# Patient Record
Sex: Male | Born: 1955
Health system: Southern US, Community
[De-identification: ages and names within clinical notes are randomized; demographics above are authoritative.]

## PROBLEM LIST (undated history)

## (undated) DIAGNOSIS — I10 Essential (primary) hypertension: Secondary | ICD-10-CM

## (undated) DIAGNOSIS — E785 Hyperlipidemia, unspecified: Secondary | ICD-10-CM

## (undated) DIAGNOSIS — Q639 Congenital malformation of kidney, unspecified: Secondary | ICD-10-CM

## (undated) DIAGNOSIS — K760 Fatty (change of) liver, not elsewhere classified: Secondary | ICD-10-CM

## (undated) HISTORY — PX: CARPAL TUNNEL RELEASE: SHX101

## (undated) HISTORY — DX: Essential (primary) hypertension: I10

## (undated) HISTORY — DX: Congenital malformation of kidney, unspecified: Q63.9

## (undated) HISTORY — PX: COLONOSCOPY: SHX174

## (undated) HISTORY — PX: VASECTOMY: SHX75

## (undated) HISTORY — PX: TONSILLECTOMY: SUR1361

## (undated) HISTORY — DX: Fatty (change of) liver, not elsewhere classified: K76.0

## (undated) HISTORY — DX: Hyperlipidemia, unspecified: E78.5

---

## 2006-09-13 ENCOUNTER — Ambulatory Visit (HOSPITAL_COMMUNITY): Admission: RE | Admit: 2006-09-13 | Discharge: 2006-09-13 | Payer: Self-pay | Admitting: Family Medicine

## 2010-03-25 ENCOUNTER — Encounter (INDEPENDENT_AMBULATORY_CARE_PROVIDER_SITE_OTHER): Payer: Self-pay | Admitting: *Deleted

## 2010-04-14 ENCOUNTER — Encounter (INDEPENDENT_AMBULATORY_CARE_PROVIDER_SITE_OTHER): Payer: Self-pay | Admitting: *Deleted

## 2010-04-16 ENCOUNTER — Ambulatory Visit: Payer: Self-pay | Admitting: Gastroenterology

## 2010-04-16 ENCOUNTER — Encounter (INDEPENDENT_AMBULATORY_CARE_PROVIDER_SITE_OTHER): Payer: Self-pay | Admitting: *Deleted

## 2010-04-30 ENCOUNTER — Ambulatory Visit: Payer: Self-pay | Admitting: Gastroenterology

## 2010-10-21 NOTE — Letter (Signed)
Summary: [facility] Instructions  [facility]  [address] [place], [place] [address]   Phone: [phone]  Fax: [phone]       Cody Powers    Dec 07, 1955    MRN: [id number]        Procedure Day /Date: 04/30/10  Wednesday     Arrival Time: 9:00am      Procedure Time: 10:00am     Location of Procedure:                    _ x_  [facility] (4th Floor)                        PREPARATION FOR COLONOSCOPY WITH MOVIPREP   Starting 5 days prior to your procedure _ 8/5/11_ do not eat nuts, seeds, popcorn, corn, beans, peas,  salads, or any raw vegetables.  Do not take any fiber supplements (e.g. Metamucil, Citrucel, and Benefiber).  THE DAY BEFORE YOUR PROCEDURE         DATE:  04/29/10   DAY:  Tuesday  1.  Drink clear liquids the entire day-NO SOLID FOOD  2.  Do not drink anything colored red or purple.  Avoid juices with pulp.  No orange juice.  3.  Drink at least 64 oz. (8 glasses) of fluid/clear liquids during the day to prevent dehydration and help the prep work efficiently.  CLEAR LIQUIDS INCLUDE: Water Jello Ice Popsicles Tea (sugar ok, no milk/cream) Powdered fruit flavored drinks Coffee (sugar ok, no milk/cream) Gatorade Juice: apple, white grape, white cranberry  Lemonade Clear bullion, consomm, broth Carbonated beverages (any kind) Strained chicken noodle soup Hard Candy                             4.  In the morning, mix first dose of MoviPrep solution:    Empty 1 Pouch A and 1 Pouch B into the disposable container    Add lukewarm drinking water to the top line of the container. Mix to dissolve    Refrigerate (mixed solution should be used within 24 hrs)  5.  Begin drinking the prep at 5:00 p.m. The MoviPrep container is divided by 4 marks.   Every 15 minutes drink the solution down to the next mark (approximately 8 oz) until the full liter is complete.   6.  Follow completed prep with 16 oz of clear liquid of your choice  (Nothing red or purple).  Continue to drink clear liquids until bedtime.  7.  Before going to bed, mix second dose of MoviPrep solution:    Empty 1 Pouch A and 1 Pouch B into the disposable container    Add lukewarm drinking water to the top line of the container. Mix to dissolve    Refrigerate  THE DAY OF YOUR PROCEDURE      DATE:   04/30/10  DAY:  Wednesday  Beginning at  5:00am (5 hours before procedure):         1. Every 15 minutes, drink the solution down to the next mark (approx 8 oz) until the full liter is complete.  2. Follow completed prep with 16 oz. of clear liquid of your choice.    3. You may drink clear liquids until 8:00am   (2 HOURS BEFORE PROCEDURE).   MEDICATION INSTRUCTIONS  Unless otherwise instructed, you should take regular prescription medications with a small sip of water  as early as possible the morning of your procedure.  Diabetic patients - see separate instructions.        OTHER INSTRUCTIONS  You will need a responsible adult at least 53 years of age to accompany you and drive you home.   This person must remain in the waiting room during your procedure.  Wear loose fitting clothing that is easily removed.  Leave jewelry and other valuables at home.  However, you may wish to bring a book to read or  an iPod/MP3 player to listen to music as you wait for your procedure to start.  Remove all body piercing jewelry and leave at home.  Total time from sign-in until discharge is approximately 2-3 hours.  You should go home directly after your procedure and rest.  You can resume normal activities the  day after your procedure.  The day of your procedure you should not:   Drive   Make legal decisions   Operate machinery   Drink alcohol   Return to work  You will receive specific instructions about eating, activities and medications before you leave.    The above instructions have been reviewed and explained to me by  Wyona Almas  RN  April 16, 2010 4:45 PM     I fully understand and can verbalize these instructions _____________________________ Date _________

## 2010-10-21 NOTE — Letter (Signed)
Summary: Diabetic Instructions  [facility]  [address] [place], [place] [address]   Phone: [phone]  Fax: [phone]    Cody Powers [birth year] MRN: [id number]   _ X _   ORAL DIABETIC MEDICATION INSTRUCTIONS  The day before your procedure:   Take your diabetic pill as you do normally  The day of your procedure:   Do not take your diabetic pill    We will check your blood sugar levels during the admission process and again in Recovery before discharging you home  ________________________________________________________________________

## 2010-10-21 NOTE — Letter (Signed)
Summary: Previsit letter  [facility]  [address] [place], [place] [address]   Phone: [phone]  Fax: [phone]       03/25/2010 MRN: [id number]  Cody Powers [address] [place], [place]  [address]  Dear Mr. [facility],  Welcome to the Gastroenterology Division at [facility].    You are scheduled to see a nurse for your pre-procedure visit on 04-16-10 at 4:30p.m. on the 3rd floor at [facility], [address]  We ask that you try to arrive at our office 15 minutes prior to your appointment time to allow for check-in.  Your nurse visit will consist of discussing your medical and surgical history, your immediate family medical history, and your medications.    Please bring a complete list of all your medications or, if you prefer, bring the medication bottles and we will list them.  We will need to be aware of both prescribed and over the counter drugs.  We will need to know exact dosage information as well.  If you are on blood thinners (Coumadin, Plavix, Aggrenox, Ticlid, etc.) please call our office today/prior to your appointment, as we need to consult with your physician about holding your medication.   Please be prepared to read and sign documents such as consent forms, a financial agreement, and acknowledgement forms.  If necessary, and with your consent, a friend or relative is welcome to sit-in on the nurse visit with you.  Please bring your insurance card so that we may make a copy of it.  If your insurance requires a referral to see a specialist, please bring your referral form from your primary care physician.  No co-pay is required for this nurse visit.     If you cannot keep your appointment, please call [phone] to cancel or reschedule prior to your appointment date.  This allows [place] the opportunity to schedule an appointment for another patient in need of care.    Thank you for choosing [facility] for your medical needs.   We appreciate the opportunity to care for you.  Please visit [place] at our website  to learn more about our practice.                     Sincerely.                                                                                                                   The Gastroenterology Division

## 2010-10-21 NOTE — Procedures (Signed)
Summary: Colonoscopy  Patient: [name] Note: All result statuses are Final unless otherwise noted.  Tests: (1) Colonoscopy (COL)   COL Colonoscopy           DONE     [facility]     [address]     [place], [place]  [address]           COLONOSCOPY PROCEDURE REPORT           PATIENT:  Cody Powers, Cody Powers  MR#:  [id number]     BIRTHDATE:  06-04-1956, 53 yrs. old  GENDER:  male           ENDOSCOPIST:  Barbette Hair. Arlyce Dice, MD     Referred by:           PROCEDURE DATE:  04/30/2010     PROCEDURE:  Diagnostic Colonoscopy     ASA CLASS:  Class I     INDICATIONS:  1) Routine Risk Screening           MEDICATIONS:   Fentanyl 75 mcg IV, Versed 8 mg IV           DESCRIPTION OF PROCEDURE:   After the risks benefits and     alternatives of the procedure were thoroughly explained, informed     consent was obtained.  Digital rectal exam was performed and     revealed no abnormalities.   The LB CF-[id number] endoscope     was introduced through the anus and advanced to the cecum, which     was identified by both the appendix and ileocecal valve, without     limitations.  The quality of the prep was excellent, using     MoviPrep.  The instrument was then slowly withdrawn as the colon     was fully examined.     <<PROCEDUREIMAGES>>           FINDINGS:  A lipoma was found in the ascending colon (see image4).     4mm yellow subcutaneous mass c/w lipoma  This was otherwise a     normal examination of the colon (see image1, image3, image5,     image7, image9, image12, image15, image16, and image19).     Retroflexed views in the rectum revealed no abnormalities.    The     time to cecum =  2.25  minutes. The scope was then withdrawn (time     =  6.75  min) from the patient and the procedure completed.           COMPLICATIONS:  None           ENDOSCOPIC IMPRESSION:     1) Lipoma in the ascending colon     2) Otherwise normal examination     RECOMMENDATIONS:     1) Continue  current colorectal screening recommendations for     "routine risk" patients with a repeat colonoscopy in 10 years.           REPEAT EXAM:  In 10 year(s) for Colonoscopy.           ______________________________     Barbette Hair. Arlyce Dice, MD           CC: Dwana Melena MD           n.     Rosalie DoctorBarbette Hair. Apolonia Ellwood at 04/30/2010 10:52 AM           Crawford Givens, [id number]  Note: An exclamation mark (!) indicates a result  that was not dispersed into the flowsheet. Document Creation Date: 04/30/2010 10:52 AM _______________________________________________________________________  (1) Order result status: Final Collection or observation date-time: 04/30/2010 10:45 Requested date-time:  Receipt date-time:  Reported date-time:  Referring Physician:   Ordering Physician: Melvia Heaps [phone]) Specimen Source:  Source: Launa Grill Order Number: [id number] Lab site:   Appended Document: Colonoscopy    Clinical Lists Changes  Observations: Added new observation of COLONNXTDUE: 04/2020 (04/30/2010 12:47)

## 2010-10-21 NOTE — Miscellaneous (Signed)
Summary: [facility] Previsit/prep  Clinical Lists Changes  Medications: Added new medication of MOVIPREP 100 GM  SOLR (PEG-KCL-NACL-NASULF-NA ASC-C) As per prep instructions. - Signed Rx of MOVIPREP 100 GM  SOLR (PEG-KCL-NACL-NASULF-NA ASC-C) As per prep instructions.;  #1 x 0;  Signed;  Entered by: Wyona Almas RN;  Authorized by: Louis Meckel MD;  Method used: Electronically to [facility]. [phone]*, [address] [place], [place], [place]  [address], Ph: [phone] or [phone], Fax: [phone] Observations: Added new observation of NKA: T (04/16/2010 16:11)    Prescriptions: MOVIPREP 100 GM  SOLR (PEG-KCL-NACL-NASULF-NA ASC-C) As per prep instructions.  #1 x 0   Entered by:   Wyona Almas RN   Authorized by:   Louis Meckel MD   Signed by:   Wyona Almas RN on 04/16/2010   Method used:   Electronically to        [facility] [phone]* (retail)       [address]       [place], [place]  [address]       Ph: [phone] or [phone]       Fax: [phone]   RxID:   [id number]

## 2010-12-05 LAB — GLUCOSE, CAPILLARY
Glucose-Capillary: 107 mg/dL — ABNORMAL HIGH (ref 70–99)
Glucose-Capillary: 122 mg/dL — ABNORMAL HIGH (ref 70–99)

## 2011-02-11 ENCOUNTER — Other Ambulatory Visit (HOSPITAL_COMMUNITY): Payer: Self-pay | Admitting: Internal Medicine

## 2011-02-11 DIAGNOSIS — R1012 Left upper quadrant pain: Secondary | ICD-10-CM

## 2011-02-13 ENCOUNTER — Other Ambulatory Visit (HOSPITAL_COMMUNITY): Payer: Self-pay

## 2011-02-17 ENCOUNTER — Ambulatory Visit (HOSPITAL_COMMUNITY)
Admission: RE | Admit: 2011-02-17 | Discharge: 2011-02-17 | Disposition: A | Discharge status: Home / Self Care | Payer: BC Managed Care – PPO | Source: Ambulatory Visit | Attending: Internal Medicine | Admitting: Internal Medicine

## 2011-02-17 DIAGNOSIS — R1012 Left upper quadrant pain: Secondary | ICD-10-CM | POA: Insufficient documentation

## 2011-02-17 DIAGNOSIS — Q619 Cystic kidney disease, unspecified: Secondary | ICD-10-CM | POA: Insufficient documentation

## 2011-02-17 DIAGNOSIS — Q602 Renal agenesis, unspecified: Secondary | ICD-10-CM | POA: Insufficient documentation

## 2011-03-02 ENCOUNTER — Telehealth: Payer: Self-pay | Admitting: Gastroenterology

## 2011-03-02 NOTE — Telephone Encounter (Signed)
Patient's wife calling to report that patient has been having right abdominal pain that goes around his back and to his shoulder blade. He saw his PCP for this and had an ultrasound because he thought it was his gallbladder. Ultrasound showed fatty liver and enlarged spleen. PCP told patient to contact his GI if the pain continued. Patient had an episode of pain again yesterday. The pain comes and goes and is not associated with eating. Patient had some diarrhea last week also. He is trying to avoid fatty foods. Wants to be seen. Scheduled with Mike Gip, PA on 03/05/11 at 3:30 PM.

## 2011-03-02 NOTE — Telephone Encounter (Signed)
Has pt been seen here before? He needs CBC, LFTs, amylase and a HIDA scan

## 2011-03-03 NOTE — Telephone Encounter (Signed)
Labs in [facility] for 03/05/11. Patient's wife aware that patient needs to get labs drawn prior to visit.

## 2011-03-05 ENCOUNTER — Other Ambulatory Visit: Payer: Self-pay | Admitting: Physician Assistant

## 2011-03-05 ENCOUNTER — Encounter: Payer: Self-pay | Admitting: Physician Assistant

## 2011-03-05 ENCOUNTER — Other Ambulatory Visit (INDEPENDENT_AMBULATORY_CARE_PROVIDER_SITE_OTHER): Payer: BC Managed Care – PPO

## 2011-03-05 ENCOUNTER — Ambulatory Visit (INDEPENDENT_AMBULATORY_CARE_PROVIDER_SITE_OTHER): Payer: BC Managed Care – PPO | Admitting: Physician Assistant

## 2011-03-05 VITALS — BP 124/70 | HR 68 | Ht 72.0 in | Wt 238.0 lb

## 2011-03-05 DIAGNOSIS — E785 Hyperlipidemia, unspecified: Secondary | ICD-10-CM

## 2011-03-05 DIAGNOSIS — K760 Fatty (change of) liver, not elsewhere classified: Secondary | ICD-10-CM | POA: Insufficient documentation

## 2011-03-05 DIAGNOSIS — M109 Gout, unspecified: Secondary | ICD-10-CM | POA: Insufficient documentation

## 2011-03-05 DIAGNOSIS — R109 Unspecified abdominal pain: Secondary | ICD-10-CM

## 2011-03-05 DIAGNOSIS — R1011 Right upper quadrant pain: Secondary | ICD-10-CM

## 2011-03-05 DIAGNOSIS — E119 Type 2 diabetes mellitus without complications: Secondary | ICD-10-CM | POA: Insufficient documentation

## 2011-03-05 DIAGNOSIS — K7689 Other specified diseases of liver: Secondary | ICD-10-CM

## 2011-03-05 DIAGNOSIS — R935 Abnormal findings on diagnostic imaging of other abdominal regions, including retroperitoneum: Secondary | ICD-10-CM

## 2011-03-05 DIAGNOSIS — R160 Hepatomegaly, not elsewhere classified: Secondary | ICD-10-CM

## 2011-03-05 DIAGNOSIS — I10 Essential (primary) hypertension: Secondary | ICD-10-CM | POA: Insufficient documentation

## 2011-03-05 LAB — BASIC METABOLIC PANEL
CO2: 27 mEq/L (ref 19–32)
Calcium: 9.4 mg/dL (ref 8.4–10.5)
Chloride: 106 mEq/L (ref 96–112)
Creatinine, Ser: 1.2 mg/dL (ref 0.4–1.5)
GFR: 67.45 mL/min (ref >60.00)
Glucose, Bld: 106 mg/dL — ABNORMAL HIGH (ref 70–99)

## 2011-03-05 LAB — CBC WITH DIFFERENTIAL/PLATELET
Eosinophils Absolute: 0.2 10*3/uL (ref 0.0–0.7)
HCT: 41.4 % (ref 39.0–52.0)
Hemoglobin: 14 g/dL (ref 13.0–17.0)
Lymphs Abs: 2.1 10*3/uL (ref 0.7–4.0)
MCHC: 33.8 g/dL (ref 30.0–36.0)
MCV: 88.9 fl (ref 78.0–100.0)
Monocytes Relative: 7.8 % (ref 3.0–12.0)
Neutro Abs: 5.4 10*3/uL (ref 1.4–7.7)

## 2011-03-05 LAB — HEPATIC FUNCTION PANEL
ALT: 35 U/L (ref 0–53)
AST: 27 U/L (ref 0–37)
Albumin: 4.4 g/dL (ref 3.5–5.2)
Bilirubin, Direct: 0.1 mg/dL (ref 0.0–0.3)
Total Protein: 6.9 g/dL (ref 6.0–8.3)

## 2011-03-05 LAB — AMYLASE: Amylase: 52 U/L (ref 27–131)

## 2011-03-05 NOTE — Patient Instructions (Signed)
Please go to the basement level to have your labs drawn.  We scheduled the Ct Scan at [facility] Radiology Department 1st floor. Directions and contrast provided. We have given you samples of Prilosec OTC capsules, Take 1 capsule in the morning 30 min before breakfast.

## 2011-03-05 NOTE — Progress Notes (Signed)
Subjective:    Patient ID: Cody Powers, male    DOB: 07/20/1956, 55 y.o.   MRN: [id number]  HPI Cody Powers is a 55 year old white male known to Dr. Arlyce Dice from screening colonoscopy done August of 2011. He was noted to have a small lipoma in the ascending colon, and otherwise had normal exam. He is referred today for evaluation of right-sided abdominal pain and an abnormal ultrasound. Patient states that he has been having episodes of right-sided abdominal pain over the past 8-10 weeks. He initially had a bad episode with right upper quadrant pain and right back pain which he describes as "like a knife sticking in his back". This lasted for 3-4 hours was not associated with any nausea or vomiting he did have some diaphoresis and eventually eased off.. Since that time he describes "twinges" of right upper quadrant and right back pain which are occurring at least a couple of times per week. He seemed to be aggravated by EEG and an especially with greasy foods. His appetite has been fine and his weight has been stable. About 2 weekends ago he had another more severe attack but not as bad as the first. Again no nausea or vomiting. He was seen by primary care had an upper abdominal ultrasound done on May 29 and was noted to have a small amount of sludge in the gallbladder, no wall thickening,no pericholecystic   fluid and normal gallbladder wall thickness. Common bile duct 2.4 mm he does have an enlarged liver portal vein patent pancreas difficult to visualize spleen is also enlarged measuring 13.7 x 12.9 x 6.6 cm in volume volume measured at 610 mL. He had labs done as well which I do not have available at the time of this visit.  Review of old records shows CT scan in 2007 which did show hepatomegaly which was felt consistent with fatty liver, spleen was read as normal.  Patient does not drink alcohol, he is diabetic and has history of hypertension and hyperlipidemia. He also describes increased heartburn  and indigestion recently.    Review of Systems  Constitutional: Negative.   HENT: Negative.   Eyes: Negative.   Respiratory: Negative.   Cardiovascular: Negative.   Gastrointestinal: Positive for nausea and abdominal pain.  Genitourinary: Negative.   Musculoskeletal: Positive for back pain.  Skin: Negative.   Neurological: Negative.   Hematological: Negative.   Psychiatric/Behavioral: Negative.    Outpatient Encounter Prescriptions as of 03/05/2011  Medication Sig Dispense Refill  . allopurinol (ZYLOPRIM) 300 MG tablet Take 300 mg by mouth daily.        Marland Kitchen aspirin 81 MG tablet Take 81 mg by mouth daily.        . Choline Fenofibrate (TRILIPIX) 135 MG capsule Take 135 mg by mouth daily.        . indomethacin (INDOCIN) 50 MG capsule Take 50 mg by mouth 3 (three) times daily as needed.        Marland Kitchen lisinopril (PRINIVIL,ZESTRIL) 20 MG tablet Take 20 mg by mouth daily.        . metFORMIN (GLUCOPHAGE) 500 MG tablet Take 500 mg by mouth 2 (two) times daily with a meal.        . Omega-3 Fatty Acids (FISH OIL) 1000 MG CAPS Take 1 capsule by mouth daily.        . pravastatin (PRAVACHOL) 20 MG tablet Take 20 mg by mouth daily.              Objective:  Physical Exam Well-developed white male in no acute distress, alert and oriented x3, pleasant HEENT; nontraumatic normocephalic EOMI PERRLA sclera anicteric  Neck; Supple no JVD  Cardiovascular; regular rate and rhythm with S1-S2 no murmur rub or gallop  Pulmonary; clear bilaterally  Abdomen ;soft basically nontender obese bowel sounds active no palpable spleen tip liver edge palpable at right costal margin no fluid wave  Recta;l not done  Extremities/skin; benign no edema no palmar erythema or other stigmata of chronic liver disease  Psych; mood and affect normal an appropriate        Assessment & Plan:  #81 55 year old male with episodic right upper quadrant and right back pain, and ultrasound findings of gallbladder sludge, hepatomegaly and  splenomegaly. It is unclear at this time whether he is periods and biliary colic or pain secondary to hepatomegaly, and/or underlying liver disease.  Plan; CBC ,CMRT, ProTime INR and lipase today Schedule for CT scan of the abdomen and pelvis with contrast. Will consider [facility] HIDA scan depending on results of CT, and lab work. Advised low-fat low-cholesterol diet  #2 Colon neoplasia screening, up-to-date  #3 Adult onset diabetes mellitus  #4 hyperlipidemia  #5 obesity.

## 2011-03-06 ENCOUNTER — Telehealth: Payer: Self-pay | Admitting: *Deleted

## 2011-03-06 NOTE — Telephone Encounter (Signed)
Message copied by Daphine Deutscher on Fri Mar 06, 2011  1:26 PM ------      Message from: [place], [place] S      Created: Fri Mar 06, 2011 12:45 PM       PLEASE LET PT KNOW THAT ALL OF HIS LABS FROM YESTERDAY ARE NORMAL.

## 2011-03-06 NOTE — Progress Notes (Signed)
Reviewed and agree.

## 2011-03-06 NOTE — Telephone Encounter (Signed)
Left a message for patient of lab results as per Mike Gip, PA

## 2011-03-10 ENCOUNTER — Ambulatory Visit (HOSPITAL_COMMUNITY)
Admission: RE | Admit: 2011-03-10 | Discharge: 2011-03-10 | Disposition: A | Discharge status: Home / Self Care | Payer: BC Managed Care – PPO | Source: Ambulatory Visit | Attending: Physician Assistant | Admitting: Physician Assistant

## 2011-03-10 ENCOUNTER — Encounter (HOSPITAL_COMMUNITY): Payer: Self-pay

## 2011-03-10 ENCOUNTER — Telehealth: Payer: Self-pay | Admitting: Physician Assistant

## 2011-03-10 DIAGNOSIS — K7689 Other specified diseases of liver: Secondary | ICD-10-CM | POA: Insufficient documentation

## 2011-03-10 DIAGNOSIS — Q619 Cystic kidney disease, unspecified: Secondary | ICD-10-CM | POA: Insufficient documentation

## 2011-03-10 DIAGNOSIS — I10 Essential (primary) hypertension: Secondary | ICD-10-CM | POA: Insufficient documentation

## 2011-03-10 DIAGNOSIS — R1011 Right upper quadrant pain: Secondary | ICD-10-CM | POA: Insufficient documentation

## 2011-03-10 DIAGNOSIS — R935 Abnormal findings on diagnostic imaging of other abdominal regions, including retroperitoneum: Secondary | ICD-10-CM

## 2011-03-10 MED ORDER — IOHEXOL 300 MG/ML  SOLN
100.0000 mL | Freq: Once | INTRAMUSCULAR | Status: AC | PRN
  Administered 2011-03-10: 100 mL via INTRAVENOUS

## 2011-03-10 NOTE — Telephone Encounter (Signed)
The CT department of [facility] needed me to fax the orders for the CT Abdomen & Pelvis with contrast for today 03-10-2011.  The orders are in epic but they couldn't find them.  I faxed the order along with the labs, BMET for them to [phone].

## 2011-03-13 ENCOUNTER — Telehealth: Payer: Self-pay | Admitting: Physician Assistant

## 2011-03-16 ENCOUNTER — Other Ambulatory Visit: Payer: Self-pay | Admitting: Gastroenterology

## 2011-03-16 ENCOUNTER — Telehealth: Payer: Self-pay

## 2011-03-16 DIAGNOSIS — K8689 Other specified diseases of pancreas: Secondary | ICD-10-CM

## 2011-03-16 NOTE — Telephone Encounter (Signed)
Spoke with wife and she is aware of results per Mike Gip PA and MRI appt date and time.

## 2011-03-16 NOTE — Telephone Encounter (Signed)
Message copied by Michele Mcalpine on Mon Mar 16, 2011  1:54 PM ------      Message from: [place], [place] S      Created: Mon Mar 16, 2011  1:17 PM       LINDA, I CALLED AND LEFT A MESSAGE FOR THIS PT TO CALL BACK ABOUT HIS CT RESULTS. HE IS HAVING PAIN, AND CT SHOWS ABNORMAL AREA IN TAIL OF PANCREAS. HE NEEDS  TO BE SCHEDULED FOR MRI OF ABDOMEN WITH PANCREATIC PROTOCOL  WITH AND WITHOUT CONTRAST. PLEASE SCHEDULE FOR THIS WEEK IF POSSIBLE.-THANKS,AMY

## 2011-03-16 NOTE — Telephone Encounter (Signed)
Pt scheduled for MRI of abdomen with and without contrast, pancreatic protocol for 03/20/11@[facility] . Pt to arreive at 7:45am, scan time 8am. Pt to be NPO after midnight. Left message for pt to call back.

## 2011-03-20 ENCOUNTER — Ambulatory Visit (HOSPITAL_COMMUNITY)
Admission: RE | Admit: 2011-03-20 | Discharge: 2011-03-20 | Disposition: A | Discharge status: Home / Self Care | Payer: BC Managed Care – PPO | Source: Ambulatory Visit | Attending: Gastroenterology | Admitting: Gastroenterology

## 2011-03-20 DIAGNOSIS — K863 Pseudocyst of pancreas: Secondary | ICD-10-CM | POA: Insufficient documentation

## 2011-03-20 DIAGNOSIS — K8689 Other specified diseases of pancreas: Secondary | ICD-10-CM

## 2011-03-20 DIAGNOSIS — Q602 Renal agenesis, unspecified: Secondary | ICD-10-CM | POA: Insufficient documentation

## 2011-03-20 DIAGNOSIS — K862 Cyst of pancreas: Secondary | ICD-10-CM | POA: Insufficient documentation

## 2011-03-20 MED ORDER — GADOBENATE DIMEGLUMINE 529 MG/ML IV SOLN
20.0000 mL | Freq: Once | INTRAVENOUS | Status: AC | PRN
  Administered 2011-03-20: 20 mL via INTRAVENOUS

## 2011-03-23 ENCOUNTER — Telehealth: Payer: Self-pay | Admitting: Physician Assistant

## 2011-03-23 NOTE — Telephone Encounter (Signed)
MRI SHOWS CYSTS IN THE TAIL OF THE PANCREAS WHICH APPEAR BENIGN. I WOULD LIKE HIM TO HAVE APPT WITH DR. KAPLAN TO GO OVER [facility] AND DECIDE IF HE NEEDS ANY FURTHER WORKUP. PLEASE GET HIM APPT WITH DR. KAPLAN FOR ASAP.

## 2011-03-24 NOTE — Telephone Encounter (Signed)
I spoke to the pt's wife Tammy and advised her of the results of hte MRI per Mike Gip PA.  I was looking at Dr. Nita Sells schedule for July and could find no appoitment time for him. I told her we will call her back once we have a chance to find him a spot. I mentioned to Leeroy Cha we will see what we can do about getting him in. I told her I will speak to Dr. Marzetta Board nurse.

## 2011-03-24 NOTE — Telephone Encounter (Signed)
Pt scheduled to see Dr. Arlyce Dice 04/07/11@2 :30pm. Pt aware of appt date and time.

## 2011-03-24 NOTE — Telephone Encounter (Signed)
He should have an appointment sometime in the next 3 weeks.  OK to double book.

## 2011-03-24 NOTE — Telephone Encounter (Signed)
Dr. Arlyce Dice do you just want me to double book this pt for an asap appt.? Please advise.

## 2011-04-07 ENCOUNTER — Encounter: Payer: Self-pay | Admitting: Gastroenterology

## 2011-04-07 ENCOUNTER — Ambulatory Visit (INDEPENDENT_AMBULATORY_CARE_PROVIDER_SITE_OTHER): Payer: BC Managed Care – PPO | Admitting: Gastroenterology

## 2011-04-07 VITALS — BP 134/76 | HR 88 | Ht 72.0 in | Wt 237.0 lb

## 2011-04-07 DIAGNOSIS — R1011 Right upper quadrant pain: Secondary | ICD-10-CM

## 2011-04-07 MED ORDER — CELECOXIB 200 MG PO CAPS: 200.0000 mg | ORAL_CAPSULE | Freq: Two times a day (BID) | ORAL | Status: AC

## 2011-04-07 NOTE — Patient Instructions (Signed)
Follow up as needed

## 2011-04-07 NOTE — Assessment & Plan Note (Addendum)
This appears to be musculoskeletal pain.  Recommendations #1 Celebrex 200 mg daily for 14 days

## 2011-04-07 NOTE — Progress Notes (Signed)
History of Present Illness:  [name] is here for followup of abdominal pain.  He continues to c/o pain along his right flank, worsened with bending and twisting.  It is sharp and lasts for seconds at a time.  Recent CT and MRI were remarkable for benign appearing pancreatic cysts.    Review of Systems: Pertinent positive and negative review of systems were noted in the above HPI section. All other review of systems were otherwise negative.    Current Medications, Allergies, Past Medical History, Past Surgical History, Family History and Social History were reviewed in [facility] electronic medical record  Vital signs were reviewed in today's medical record. Physical Exam: General: Well developed , well nourished, no acute distress  On abdominal exam there is tenderness to palpation in the right posterior ribs that reproduces his pain. There are no dominant masses organomegaly

## 2011-04-08 ENCOUNTER — Telehealth: Payer: Self-pay | Admitting: Gastroenterology

## 2011-04-15 NOTE — Telephone Encounter (Signed)
PT NEEDS PRIOR AUTHORIZATION FOR MEDICATION CONTACTED [employer] THEY HAD BEEN FAXING THE REQUEST TO AN INCORRECT PHONE NUMBER GAVE [employer] CORRECT FAX NUMBER FOR THEM TO SEND THE PRIOR AUTH FORM.

## 2011-04-16 NOTE — Telephone Encounter (Signed)
DR Arlyce Dice, YOU PRESCRIBED CELEBREX FOR THIS PT, THE INSURANCE DONT WANT TO PAY FOR IT, IS THERE SOMETHING ELSE HE CAN TRY??

## 2011-04-17 NOTE — Telephone Encounter (Signed)
PTS INSURANCE WILL COVER MOBIC OR MOLOXICAM   DR KAPLAN WHICH DO YOU WANT TO PRESCRIBE

## 2011-04-19 NOTE — Telephone Encounter (Signed)
meloxicam 7.5 mg qd

## 2011-04-20 ENCOUNTER — Telehealth: Payer: Self-pay | Admitting: Gastroenterology

## 2011-04-20 MED ORDER — MELOXICAM 7.5 MG PO TABS: 7.5000 mg | ORAL_TABLET | Freq: Every day | ORAL | Status: AC

## 2011-04-20 NOTE — Telephone Encounter (Signed)
Meloxicam sent in for pt.

## 2011-04-20 NOTE — Telephone Encounter (Signed)
Dr Arlyce Dice approved for pt to be on Meloxicam.. This is approved through pts insurance

## 2011-07-28 NOTE — Telephone Encounter (Signed)
done

## 2012-05-06 IMAGING — US US ABDOMEN COMPLETE
1 series · 13 of 25 positions shown · non-contrast
Comparison: CT 09/13/2006.

CLINICAL DATA: History of left upper quadrant abdominal pain.
History of congenital absence of the left kidney.  History of fatty
infiltration of the liver.

ABDOMINAL ULTRASOUND COMPLETE

[Series 1: us abdomen complete · 0.34mm/px · 13 of 78 slices shown]
[im 1/78]
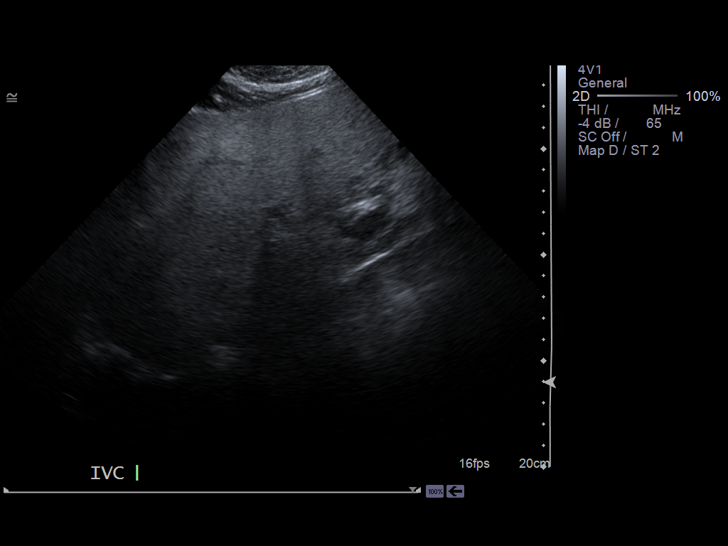
[im 7/78]
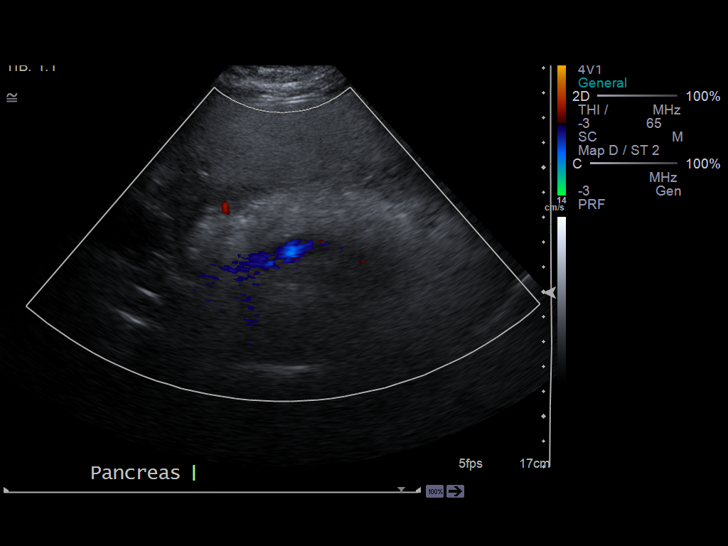
[im 13/78]
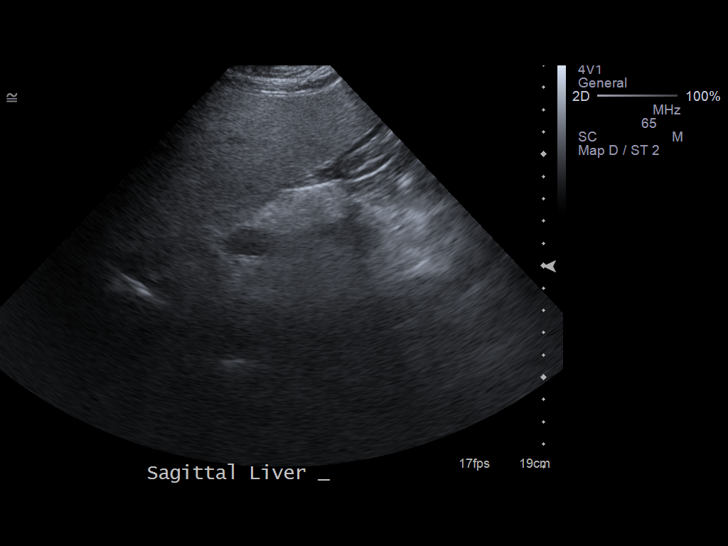
[im 20/78]
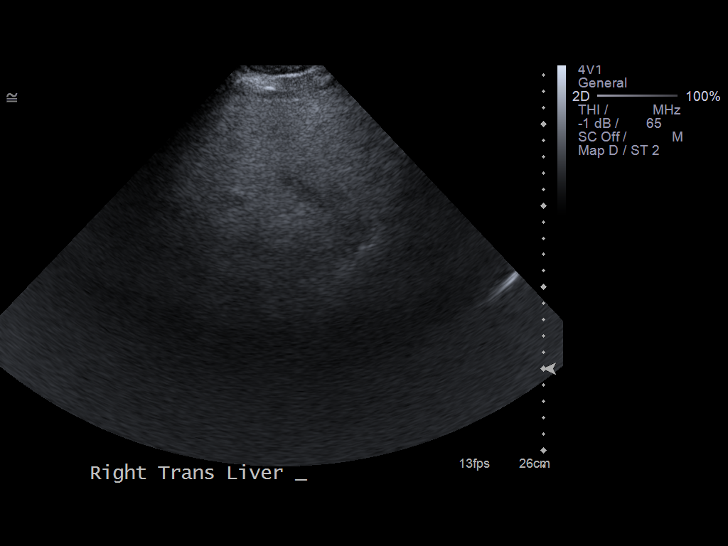
[im 26/78]
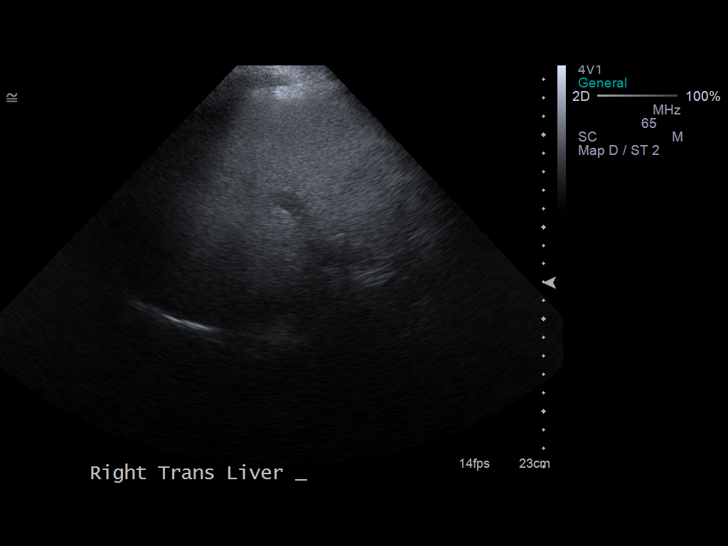
[im 33/78]
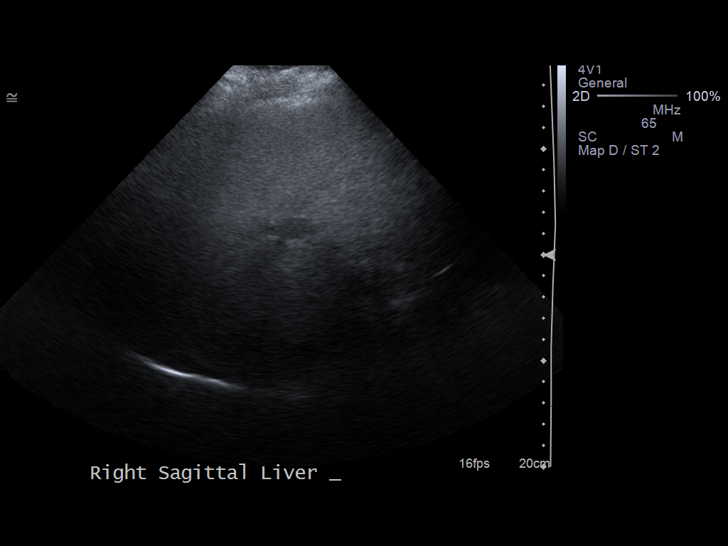
[im 39/78]
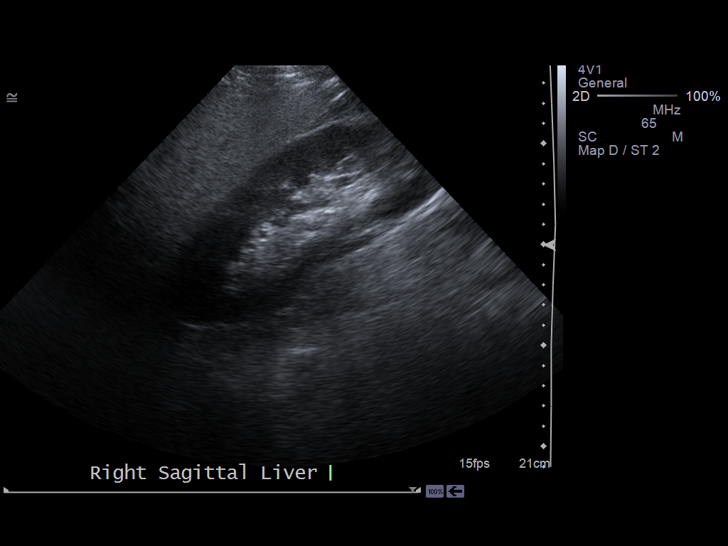
[im 45/78]
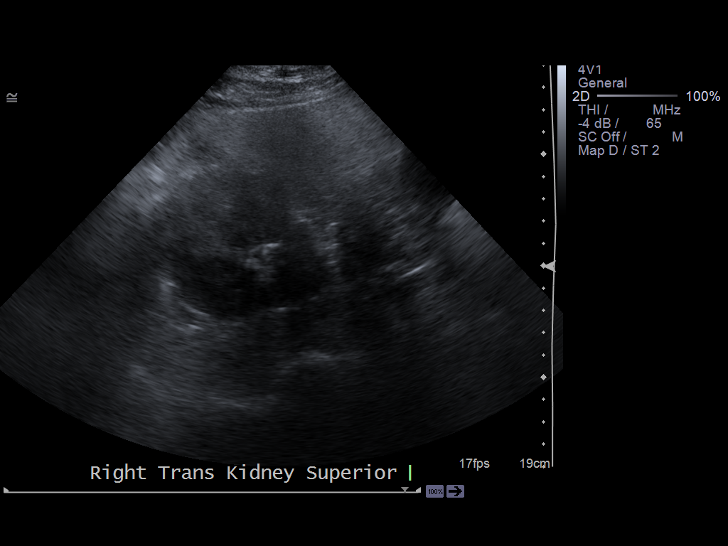
[im 52/78]
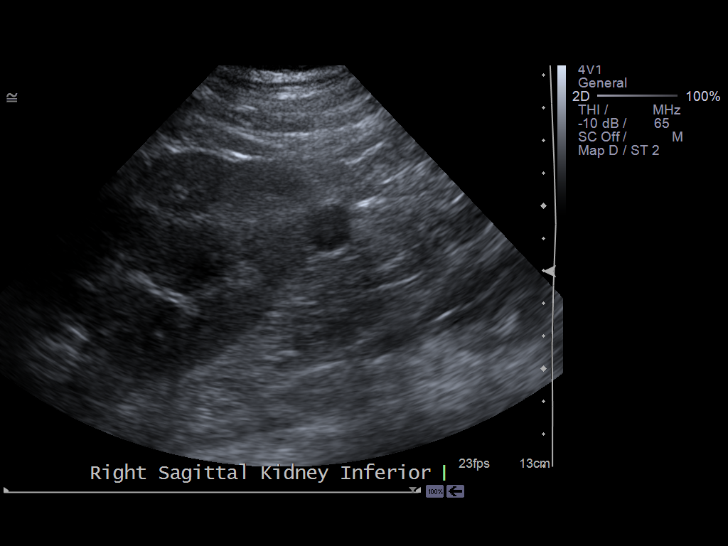
[im 58/78]
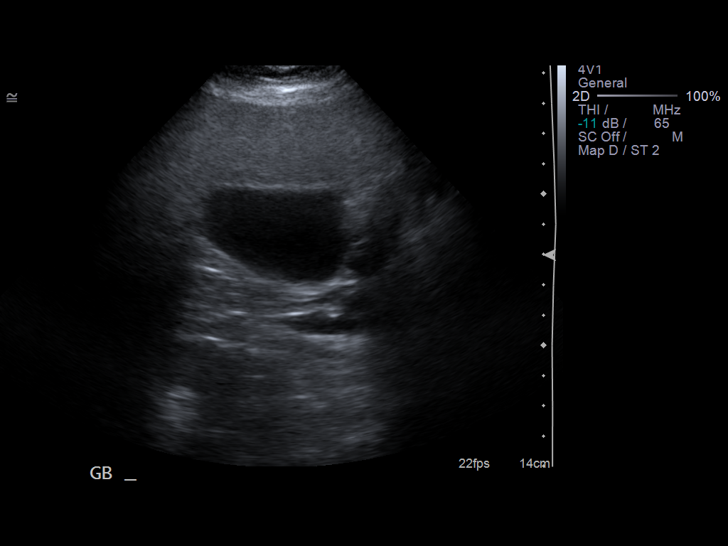
[im 65/78]
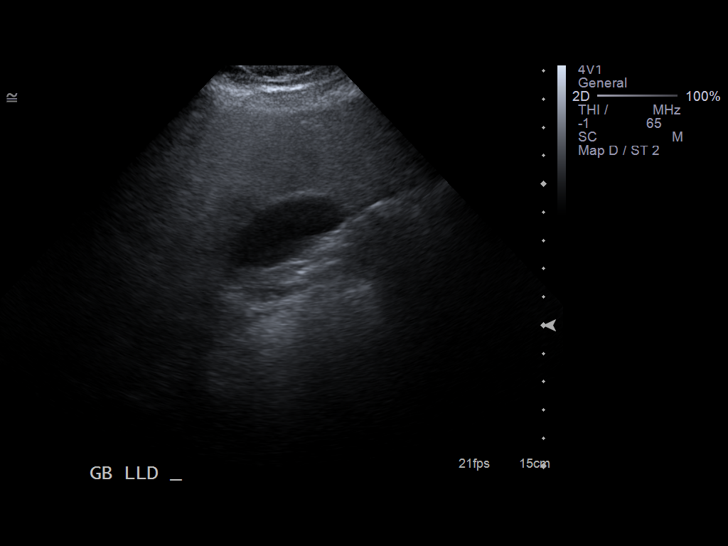
[im 71/78]
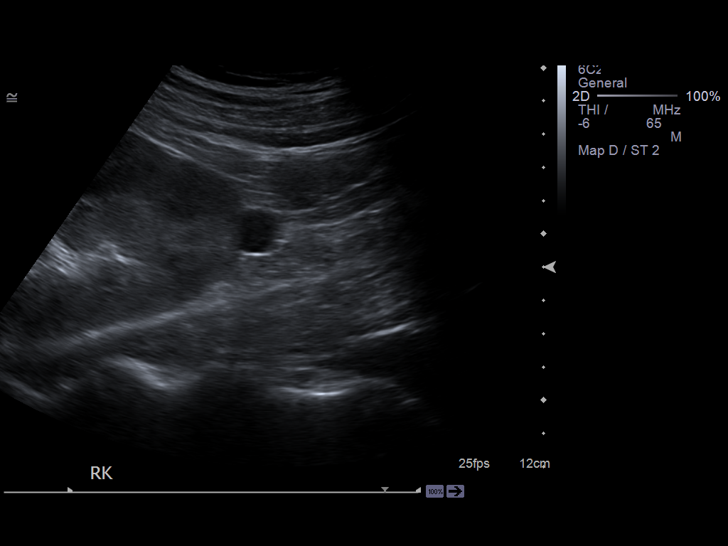
[im 78/78]
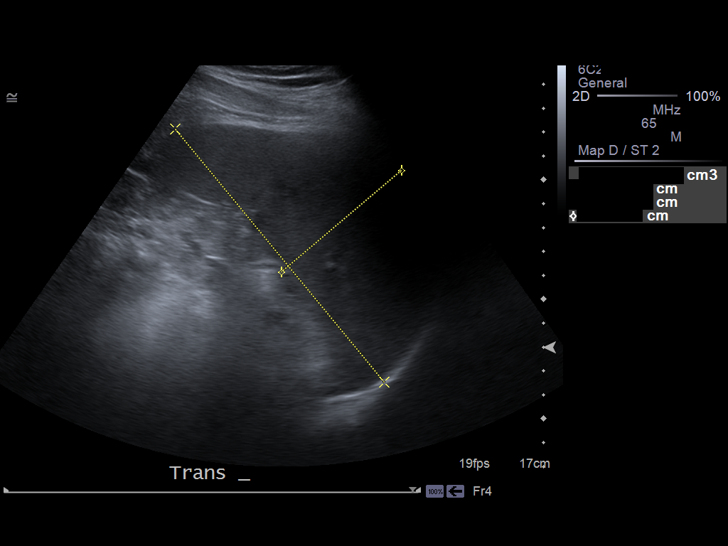

[13 of 25 positions shown; findings below may reference images not displayed]

FINDINGS: Gallbladder: No shadowing gallstones.  There is a small amount of
echogenic sludge. No gallbladder wall thickening or pericholecystic
fluid. The gallbladder wall thickness measured 1.2 mm. No
sonographic Murphy's sign according to the ultrasound technologist.

CBD: Normal in caliber measuring 2.4 mm. No choledocholithiasis is
evident.

Liver: There is hepatomegaly. There is increased echogenicity of
the hepatic parenchymal echotexture without focal parenchymal
abnormality. The portal vein is patent with hepatopetal flow by
color Doppler imaging.

IVC:  Patent throughout its visualized course in the abdomen.

Pancreas:  Although the pancreas is difficult to visualize in its
entirety, no focal pancreatic abnormality is identified.

Spleen: Spleen measures 13.7 x 12.9 x 6.6 cm.  This is
splenomegaly.  Volume measured 610 ml.  No focal splenic
abnormality was seen.

Right kidney:  No hydronephrosis.  Well-preserved cortex.  Normal
parenchymal echotexture with compensatory hypertrophy.  Right renal
length is 16.1 cm. Small lower pole renal renal cyst seen on CT.
This cyst is seen on ultrasound and the cyst measures 1.5 x
cm. Appears simple.

Left kidney: No left kidney is evident.

Aorta:  Maximum diameter is 2.9 cm.  No aneurysm is evident.

Ascites:  None.
IMPRESSION: No acute abdominal pathology was demonstrated.  No cholelithiasis
or evidence of cholecystitis.  Bile ducts normal in caliber.

Hepatomegaly.  Increased echogenicity of hepatic parenchyma.  Most
commonly this is associated with fatty infiltration of the liver.
No definite serosal nodularity or scalloping is seen.  Patent
portal vein with hepatopetal flow.

Splenomegaly.

No ascites evident.

History of congenital absence of the left kidney.  Compensatory
hypertrophy of right kidney.  Stable small right renal cyst.

## 2014-05-29 ENCOUNTER — Encounter: Payer: Self-pay | Admitting: Gastroenterology

## 2015-12-23 DIAGNOSIS — L940 Localized scleroderma [morphea]: Secondary | ICD-10-CM | POA: Diagnosis not present

## 2015-12-23 DIAGNOSIS — L570 Actinic keratosis: Secondary | ICD-10-CM | POA: Diagnosis not present

## 2016-01-07 DIAGNOSIS — E782 Mixed hyperlipidemia: Secondary | ICD-10-CM | POA: Diagnosis not present

## 2016-01-07 DIAGNOSIS — E119 Type 2 diabetes mellitus without complications: Secondary | ICD-10-CM | POA: Diagnosis not present

## 2016-01-09 DIAGNOSIS — E119 Type 2 diabetes mellitus without complications: Secondary | ICD-10-CM | POA: Diagnosis not present

## 2016-01-09 DIAGNOSIS — M1000 Idiopathic gout, unspecified site: Secondary | ICD-10-CM | POA: Diagnosis not present

## 2016-01-09 DIAGNOSIS — I10 Essential (primary) hypertension: Secondary | ICD-10-CM | POA: Diagnosis not present

## 2016-01-09 DIAGNOSIS — E782 Mixed hyperlipidemia: Secondary | ICD-10-CM | POA: Diagnosis not present

## 2016-01-09 DIAGNOSIS — Z23 Encounter for immunization: Secondary | ICD-10-CM | POA: Diagnosis not present

## 2016-06-24 DIAGNOSIS — L820 Inflamed seborrheic keratosis: Secondary | ICD-10-CM | POA: Diagnosis not present

## 2016-06-24 DIAGNOSIS — L821 Other seborrheic keratosis: Secondary | ICD-10-CM | POA: Diagnosis not present

## 2016-06-24 DIAGNOSIS — D485 Neoplasm of uncertain behavior of skin: Secondary | ICD-10-CM | POA: Diagnosis not present

## 2016-08-05 DIAGNOSIS — E782 Mixed hyperlipidemia: Secondary | ICD-10-CM | POA: Diagnosis not present

## 2016-08-05 DIAGNOSIS — E119 Type 2 diabetes mellitus without complications: Secondary | ICD-10-CM | POA: Diagnosis not present

## 2016-08-07 DIAGNOSIS — E119 Type 2 diabetes mellitus without complications: Secondary | ICD-10-CM | POA: Diagnosis not present

## 2016-08-07 DIAGNOSIS — M109 Gout, unspecified: Secondary | ICD-10-CM | POA: Diagnosis not present

## 2016-08-07 DIAGNOSIS — E782 Mixed hyperlipidemia: Secondary | ICD-10-CM | POA: Diagnosis not present

## 2016-08-07 DIAGNOSIS — Z23 Encounter for immunization: Secondary | ICD-10-CM | POA: Diagnosis not present

## 2016-08-07 DIAGNOSIS — I10 Essential (primary) hypertension: Secondary | ICD-10-CM | POA: Diagnosis not present

## 2016-12-02 DIAGNOSIS — Z6831 Body mass index (BMI) 31.0-31.9, adult: Secondary | ICD-10-CM | POA: Diagnosis not present

## 2016-12-02 DIAGNOSIS — E782 Mixed hyperlipidemia: Secondary | ICD-10-CM | POA: Diagnosis not present

## 2016-12-22 DIAGNOSIS — L570 Actinic keratosis: Secondary | ICD-10-CM | POA: Diagnosis not present

## 2016-12-22 DIAGNOSIS — L821 Other seborrheic keratosis: Secondary | ICD-10-CM | POA: Diagnosis not present

## 2017-01-26 DIAGNOSIS — I10 Essential (primary) hypertension: Secondary | ICD-10-CM | POA: Diagnosis not present

## 2017-01-26 DIAGNOSIS — E119 Type 2 diabetes mellitus without complications: Secondary | ICD-10-CM | POA: Diagnosis not present

## 2017-01-28 DIAGNOSIS — Z23 Encounter for immunization: Secondary | ICD-10-CM | POA: Diagnosis not present

## 2017-01-28 DIAGNOSIS — E782 Mixed hyperlipidemia: Secondary | ICD-10-CM | POA: Diagnosis not present

## 2017-01-28 DIAGNOSIS — E119 Type 2 diabetes mellitus without complications: Secondary | ICD-10-CM | POA: Diagnosis not present

## 2017-01-28 DIAGNOSIS — I10 Essential (primary) hypertension: Secondary | ICD-10-CM | POA: Diagnosis not present

## 2017-01-28 DIAGNOSIS — E790 Hyperuricemia without signs of inflammatory arthritis and tophaceous disease: Secondary | ICD-10-CM | POA: Diagnosis not present

## 2017-06-30 DIAGNOSIS — I10 Essential (primary) hypertension: Secondary | ICD-10-CM | POA: Diagnosis not present

## 2017-06-30 DIAGNOSIS — E119 Type 2 diabetes mellitus without complications: Secondary | ICD-10-CM | POA: Diagnosis not present

## 2017-06-30 DIAGNOSIS — E782 Mixed hyperlipidemia: Secondary | ICD-10-CM | POA: Diagnosis not present

## 2017-07-02 DIAGNOSIS — E790 Hyperuricemia without signs of inflammatory arthritis and tophaceous disease: Secondary | ICD-10-CM | POA: Diagnosis not present

## 2017-07-02 DIAGNOSIS — E119 Type 2 diabetes mellitus without complications: Secondary | ICD-10-CM | POA: Diagnosis not present

## 2017-07-02 DIAGNOSIS — Z23 Encounter for immunization: Secondary | ICD-10-CM | POA: Diagnosis not present

## 2017-07-02 DIAGNOSIS — E782 Mixed hyperlipidemia: Secondary | ICD-10-CM | POA: Diagnosis not present

## 2017-07-02 DIAGNOSIS — I10 Essential (primary) hypertension: Secondary | ICD-10-CM | POA: Diagnosis not present

## 2017-12-31 DIAGNOSIS — E119 Type 2 diabetes mellitus without complications: Secondary | ICD-10-CM | POA: Diagnosis not present

## 2017-12-31 DIAGNOSIS — E782 Mixed hyperlipidemia: Secondary | ICD-10-CM | POA: Diagnosis not present

## 2017-12-31 DIAGNOSIS — I10 Essential (primary) hypertension: Secondary | ICD-10-CM | POA: Diagnosis not present

## 2018-01-03 DIAGNOSIS — E782 Mixed hyperlipidemia: Secondary | ICD-10-CM | POA: Diagnosis not present

## 2018-01-03 DIAGNOSIS — I10 Essential (primary) hypertension: Secondary | ICD-10-CM | POA: Diagnosis not present

## 2018-01-03 DIAGNOSIS — E119 Type 2 diabetes mellitus without complications: Secondary | ICD-10-CM | POA: Diagnosis not present

## 2018-01-03 DIAGNOSIS — M25562 Pain in left knee: Secondary | ICD-10-CM | POA: Diagnosis not present

## 2018-01-22 DIAGNOSIS — Z6830 Body mass index (BMI) 30.0-30.9, adult: Secondary | ICD-10-CM | POA: Diagnosis not present

## 2018-01-22 DIAGNOSIS — R05 Cough: Secondary | ICD-10-CM | POA: Diagnosis not present

## 2018-07-05 DIAGNOSIS — E119 Type 2 diabetes mellitus without complications: Secondary | ICD-10-CM | POA: Diagnosis not present

## 2018-07-05 DIAGNOSIS — I10 Essential (primary) hypertension: Secondary | ICD-10-CM | POA: Diagnosis not present

## 2018-07-05 DIAGNOSIS — E782 Mixed hyperlipidemia: Secondary | ICD-10-CM | POA: Diagnosis not present

## 2018-07-05 DIAGNOSIS — E790 Hyperuricemia without signs of inflammatory arthritis and tophaceous disease: Secondary | ICD-10-CM | POA: Diagnosis not present

## 2018-07-05 DIAGNOSIS — R05 Cough: Secondary | ICD-10-CM | POA: Diagnosis not present

## 2018-07-05 DIAGNOSIS — Z6830 Body mass index (BMI) 30.0-30.9, adult: Secondary | ICD-10-CM | POA: Diagnosis not present

## 2018-07-08 DIAGNOSIS — I10 Essential (primary) hypertension: Secondary | ICD-10-CM | POA: Diagnosis not present

## 2018-07-08 DIAGNOSIS — Z23 Encounter for immunization: Secondary | ICD-10-CM | POA: Diagnosis not present

## 2018-07-08 DIAGNOSIS — Z0000 Encounter for general adult medical examination without abnormal findings: Secondary | ICD-10-CM | POA: Diagnosis not present

## 2018-07-08 DIAGNOSIS — E782 Mixed hyperlipidemia: Secondary | ICD-10-CM | POA: Diagnosis not present

## 2018-07-08 DIAGNOSIS — E1169 Type 2 diabetes mellitus with other specified complication: Secondary | ICD-10-CM | POA: Diagnosis not present

## 2018-09-02 DIAGNOSIS — J060 Acute laryngopharyngitis: Secondary | ICD-10-CM | POA: Diagnosis not present

## 2018-11-04 DIAGNOSIS — I10 Essential (primary) hypertension: Secondary | ICD-10-CM | POA: Diagnosis not present

## 2018-11-04 DIAGNOSIS — E119 Type 2 diabetes mellitus without complications: Secondary | ICD-10-CM | POA: Diagnosis not present

## 2018-11-04 DIAGNOSIS — E782 Mixed hyperlipidemia: Secondary | ICD-10-CM | POA: Diagnosis not present

## 2018-11-04 DIAGNOSIS — E1169 Type 2 diabetes mellitus with other specified complication: Secondary | ICD-10-CM | POA: Diagnosis not present

## 2018-11-09 DIAGNOSIS — E1169 Type 2 diabetes mellitus with other specified complication: Secondary | ICD-10-CM | POA: Diagnosis not present

## 2018-11-09 DIAGNOSIS — E782 Mixed hyperlipidemia: Secondary | ICD-10-CM | POA: Diagnosis not present

## 2018-11-09 DIAGNOSIS — I10 Essential (primary) hypertension: Secondary | ICD-10-CM | POA: Diagnosis not present

## 2018-11-09 DIAGNOSIS — M170 Bilateral primary osteoarthritis of knee: Secondary | ICD-10-CM | POA: Diagnosis not present

## 2018-12-02 DIAGNOSIS — I10 Essential (primary) hypertension: Secondary | ICD-10-CM | POA: Diagnosis not present

## 2018-12-02 DIAGNOSIS — M25512 Pain in left shoulder: Secondary | ICD-10-CM | POA: Diagnosis not present

## 2019-02-16 DIAGNOSIS — I10 Essential (primary) hypertension: Secondary | ICD-10-CM | POA: Diagnosis not present

## 2019-02-16 DIAGNOSIS — G43C0 Periodic headache syndromes in child or adult, not intractable: Secondary | ICD-10-CM | POA: Diagnosis not present

## 2019-02-16 DIAGNOSIS — H68103 Unspecified obstruction of Eustachian tube, bilateral: Secondary | ICD-10-CM | POA: Diagnosis not present

## 2019-03-08 DIAGNOSIS — E782 Mixed hyperlipidemia: Secondary | ICD-10-CM | POA: Diagnosis not present

## 2019-03-08 DIAGNOSIS — I10 Essential (primary) hypertension: Secondary | ICD-10-CM | POA: Diagnosis not present

## 2019-03-08 DIAGNOSIS — E1169 Type 2 diabetes mellitus with other specified complication: Secondary | ICD-10-CM | POA: Diagnosis not present

## 2019-03-08 DIAGNOSIS — E119 Type 2 diabetes mellitus without complications: Secondary | ICD-10-CM | POA: Diagnosis not present

## 2019-03-15 DIAGNOSIS — E1169 Type 2 diabetes mellitus with other specified complication: Secondary | ICD-10-CM | POA: Diagnosis not present

## 2019-03-15 DIAGNOSIS — I10 Essential (primary) hypertension: Secondary | ICD-10-CM | POA: Diagnosis not present

## 2019-03-15 DIAGNOSIS — E782 Mixed hyperlipidemia: Secondary | ICD-10-CM | POA: Diagnosis not present

## 2019-03-15 DIAGNOSIS — M170 Bilateral primary osteoarthritis of knee: Secondary | ICD-10-CM | POA: Diagnosis not present

## 2019-06-22 DIAGNOSIS — E1169 Type 2 diabetes mellitus with other specified complication: Secondary | ICD-10-CM | POA: Diagnosis not present

## 2019-06-22 DIAGNOSIS — E782 Mixed hyperlipidemia: Secondary | ICD-10-CM | POA: Diagnosis not present

## 2019-06-22 DIAGNOSIS — I10 Essential (primary) hypertension: Secondary | ICD-10-CM | POA: Diagnosis not present

## 2019-06-27 DIAGNOSIS — E1169 Type 2 diabetes mellitus with other specified complication: Secondary | ICD-10-CM | POA: Diagnosis not present

## 2019-06-27 DIAGNOSIS — E782 Mixed hyperlipidemia: Secondary | ICD-10-CM | POA: Diagnosis not present

## 2019-06-27 DIAGNOSIS — I10 Essential (primary) hypertension: Secondary | ICD-10-CM | POA: Diagnosis not present

## 2019-06-27 DIAGNOSIS — Z23 Encounter for immunization: Secondary | ICD-10-CM | POA: Diagnosis not present

## 2019-06-28 ENCOUNTER — Other Ambulatory Visit: Payer: Self-pay | Admitting: Internal Medicine

## 2019-06-28 ENCOUNTER — Other Ambulatory Visit (HOSPITAL_COMMUNITY): Payer: Self-pay | Admitting: Internal Medicine

## 2019-06-28 DIAGNOSIS — G5690 Unspecified mononeuropathy of unspecified upper limb: Secondary | ICD-10-CM

## 2019-07-06 ENCOUNTER — Other Ambulatory Visit: Payer: Self-pay

## 2019-07-06 ENCOUNTER — Ambulatory Visit (HOSPITAL_COMMUNITY)
Admission: RE | Admit: 2019-07-06 | Discharge: 2019-07-06 | Disposition: A | Discharge status: Home / Self Care | Payer: BC Managed Care – PPO | Source: Ambulatory Visit | Attending: Internal Medicine | Admitting: Internal Medicine

## 2019-07-06 ENCOUNTER — Other Ambulatory Visit (HOSPITAL_COMMUNITY): Payer: Self-pay | Admitting: Internal Medicine

## 2019-07-06 DIAGNOSIS — G5690 Unspecified mononeuropathy of unspecified upper limb: Secondary | ICD-10-CM | POA: Insufficient documentation

## 2019-07-06 DIAGNOSIS — R200 Anesthesia of skin: Secondary | ICD-10-CM | POA: Insufficient documentation

## 2019-07-06 DIAGNOSIS — M542 Cervicalgia: Secondary | ICD-10-CM | POA: Diagnosis not present

## 2019-08-29 DIAGNOSIS — I10 Essential (primary) hypertension: Secondary | ICD-10-CM | POA: Diagnosis not present

## 2019-08-29 DIAGNOSIS — Z6833 Body mass index (BMI) 33.0-33.9, adult: Secondary | ICD-10-CM | POA: Diagnosis not present

## 2019-09-18 DIAGNOSIS — M4802 Spinal stenosis, cervical region: Secondary | ICD-10-CM | POA: Diagnosis not present

## 2019-09-18 DIAGNOSIS — G5601 Carpal tunnel syndrome, right upper limb: Secondary | ICD-10-CM | POA: Diagnosis not present

## 2019-09-19 DIAGNOSIS — I10 Essential (primary) hypertension: Secondary | ICD-10-CM | POA: Diagnosis not present

## 2019-09-19 DIAGNOSIS — G5601 Carpal tunnel syndrome, right upper limb: Secondary | ICD-10-CM | POA: Diagnosis not present

## 2019-09-19 DIAGNOSIS — Z6833 Body mass index (BMI) 33.0-33.9, adult: Secondary | ICD-10-CM | POA: Diagnosis not present

## 2019-10-05 ENCOUNTER — Ambulatory Visit: Payer: BC Managed Care – PPO | Attending: Internal Medicine

## 2019-10-05 ENCOUNTER — Other Ambulatory Visit: Payer: Self-pay

## 2019-10-05 DIAGNOSIS — Z20822 Contact with and (suspected) exposure to covid-19: Secondary | ICD-10-CM

## 2019-10-05 DIAGNOSIS — E1169 Type 2 diabetes mellitus with other specified complication: Secondary | ICD-10-CM | POA: Diagnosis not present

## 2019-10-05 DIAGNOSIS — Z20828 Contact with and (suspected) exposure to other viral communicable diseases: Secondary | ICD-10-CM | POA: Diagnosis not present

## 2019-10-05 DIAGNOSIS — Z0000 Encounter for general adult medical examination without abnormal findings: Secondary | ICD-10-CM | POA: Diagnosis not present

## 2019-10-05 DIAGNOSIS — U071 COVID-19: Secondary | ICD-10-CM | POA: Diagnosis not present

## 2019-10-05 DIAGNOSIS — J060 Acute laryngopharyngitis: Secondary | ICD-10-CM | POA: Diagnosis not present

## 2019-10-05 DIAGNOSIS — M170 Bilateral primary osteoarthritis of knee: Secondary | ICD-10-CM | POA: Diagnosis not present

## 2019-10-05 NOTE — Addendum Note (Signed)
Addended by: Kathlene November on: 10/05/2019 02:33 PM   Modules accepted: Orders

## 2019-10-12 DIAGNOSIS — E119 Type 2 diabetes mellitus without complications: Secondary | ICD-10-CM | POA: Diagnosis not present

## 2019-10-12 DIAGNOSIS — I10 Essential (primary) hypertension: Secondary | ICD-10-CM | POA: Diagnosis not present

## 2019-10-12 DIAGNOSIS — E1169 Type 2 diabetes mellitus with other specified complication: Secondary | ICD-10-CM | POA: Diagnosis not present

## 2019-10-12 DIAGNOSIS — E782 Mixed hyperlipidemia: Secondary | ICD-10-CM | POA: Diagnosis not present

## 2019-10-16 DIAGNOSIS — E782 Mixed hyperlipidemia: Secondary | ICD-10-CM | POA: Diagnosis not present

## 2019-10-16 DIAGNOSIS — M170 Bilateral primary osteoarthritis of knee: Secondary | ICD-10-CM | POA: Diagnosis not present

## 2019-10-16 DIAGNOSIS — E1169 Type 2 diabetes mellitus with other specified complication: Secondary | ICD-10-CM | POA: Diagnosis not present

## 2019-10-16 DIAGNOSIS — I10 Essential (primary) hypertension: Secondary | ICD-10-CM | POA: Diagnosis not present

## 2019-11-20 DIAGNOSIS — M79641 Pain in right hand: Secondary | ICD-10-CM | POA: Diagnosis not present

## 2019-11-20 DIAGNOSIS — G5601 Carpal tunnel syndrome, right upper limb: Secondary | ICD-10-CM | POA: Diagnosis not present

## 2019-12-14 DIAGNOSIS — G5601 Carpal tunnel syndrome, right upper limb: Secondary | ICD-10-CM | POA: Diagnosis not present

## 2019-12-20 DIAGNOSIS — L570 Actinic keratosis: Secondary | ICD-10-CM | POA: Diagnosis not present

## 2019-12-20 DIAGNOSIS — L821 Other seborrheic keratosis: Secondary | ICD-10-CM | POA: Diagnosis not present

## 2019-12-28 DIAGNOSIS — M79641 Pain in right hand: Secondary | ICD-10-CM | POA: Diagnosis not present

## 2020-01-05 DIAGNOSIS — E1169 Type 2 diabetes mellitus with other specified complication: Secondary | ICD-10-CM | POA: Diagnosis not present

## 2020-01-05 DIAGNOSIS — E119 Type 2 diabetes mellitus without complications: Secondary | ICD-10-CM | POA: Diagnosis not present

## 2020-01-05 DIAGNOSIS — E790 Hyperuricemia without signs of inflammatory arthritis and tophaceous disease: Secondary | ICD-10-CM | POA: Diagnosis not present

## 2020-01-05 DIAGNOSIS — E782 Mixed hyperlipidemia: Secondary | ICD-10-CM | POA: Diagnosis not present

## 2020-01-05 DIAGNOSIS — E6609 Other obesity due to excess calories: Secondary | ICD-10-CM | POA: Diagnosis not present

## 2020-01-17 DIAGNOSIS — I10 Essential (primary) hypertension: Secondary | ICD-10-CM | POA: Diagnosis not present

## 2020-01-17 DIAGNOSIS — M170 Bilateral primary osteoarthritis of knee: Secondary | ICD-10-CM | POA: Diagnosis not present

## 2020-01-17 DIAGNOSIS — E1169 Type 2 diabetes mellitus with other specified complication: Secondary | ICD-10-CM | POA: Diagnosis not present

## 2020-01-17 DIAGNOSIS — E782 Mixed hyperlipidemia: Secondary | ICD-10-CM | POA: Diagnosis not present

## 2020-04-18 ENCOUNTER — Encounter: Payer: Self-pay | Admitting: Gastroenterology

## 2020-04-24 DIAGNOSIS — E782 Mixed hyperlipidemia: Secondary | ICD-10-CM | POA: Diagnosis not present

## 2020-04-24 DIAGNOSIS — E119 Type 2 diabetes mellitus without complications: Secondary | ICD-10-CM | POA: Diagnosis not present

## 2020-04-24 DIAGNOSIS — I10 Essential (primary) hypertension: Secondary | ICD-10-CM | POA: Diagnosis not present

## 2020-04-24 DIAGNOSIS — E1169 Type 2 diabetes mellitus with other specified complication: Secondary | ICD-10-CM | POA: Diagnosis not present

## 2020-04-29 DIAGNOSIS — E1169 Type 2 diabetes mellitus with other specified complication: Secondary | ICD-10-CM | POA: Diagnosis not present

## 2020-04-29 DIAGNOSIS — M170 Bilateral primary osteoarthritis of knee: Secondary | ICD-10-CM | POA: Diagnosis not present

## 2020-04-29 DIAGNOSIS — I10 Essential (primary) hypertension: Secondary | ICD-10-CM | POA: Diagnosis not present

## 2020-04-29 DIAGNOSIS — E782 Mixed hyperlipidemia: Secondary | ICD-10-CM | POA: Diagnosis not present

## 2020-07-08 ENCOUNTER — Encounter: Payer: Self-pay | Admitting: Gastroenterology

## 2020-09-03 ENCOUNTER — Other Ambulatory Visit: Payer: Self-pay

## 2020-09-03 ENCOUNTER — Ambulatory Visit (AMBULATORY_SURGERY_CENTER): Payer: Self-pay | Admitting: *Deleted

## 2020-09-03 VITALS — Ht 71.0 in | Wt 228.0 lb

## 2020-09-03 DIAGNOSIS — Z1211 Encounter for screening for malignant neoplasm of colon: Secondary | ICD-10-CM

## 2020-09-03 NOTE — Progress Notes (Signed)

## 2020-09-06 ENCOUNTER — Encounter: Payer: Self-pay | Admitting: Gastroenterology

## 2020-09-11 DIAGNOSIS — Z0000 Encounter for general adult medical examination without abnormal findings: Secondary | ICD-10-CM | POA: Diagnosis not present

## 2020-09-11 DIAGNOSIS — J060 Acute laryngopharyngitis: Secondary | ICD-10-CM | POA: Diagnosis not present

## 2020-09-11 DIAGNOSIS — R202 Paresthesia of skin: Secondary | ICD-10-CM | POA: Diagnosis not present

## 2020-09-11 DIAGNOSIS — E1169 Type 2 diabetes mellitus with other specified complication: Secondary | ICD-10-CM | POA: Diagnosis not present

## 2020-09-11 DIAGNOSIS — E119 Type 2 diabetes mellitus without complications: Secondary | ICD-10-CM | POA: Diagnosis not present

## 2020-09-11 DIAGNOSIS — M170 Bilateral primary osteoarthritis of knee: Secondary | ICD-10-CM | POA: Diagnosis not present

## 2020-09-11 DIAGNOSIS — E6609 Other obesity due to excess calories: Secondary | ICD-10-CM | POA: Diagnosis not present

## 2020-09-17 ENCOUNTER — Other Ambulatory Visit: Payer: Self-pay

## 2020-09-17 ENCOUNTER — Ambulatory Visit (AMBULATORY_SURGERY_CENTER): Payer: BC Managed Care – PPO | Admitting: Gastroenterology

## 2020-09-17 ENCOUNTER — Encounter: Payer: Self-pay | Admitting: Gastroenterology

## 2020-09-17 VITALS — BP 129/83 | HR 69 | Temp 98.0°F | Resp 18 | Ht 71.0 in | Wt 228.0 lb

## 2020-09-17 DIAGNOSIS — D122 Benign neoplasm of ascending colon: Secondary | ICD-10-CM | POA: Diagnosis not present

## 2020-09-17 DIAGNOSIS — K529 Noninfective gastroenteritis and colitis, unspecified: Secondary | ICD-10-CM

## 2020-09-17 DIAGNOSIS — K633 Ulcer of intestine: Secondary | ICD-10-CM | POA: Diagnosis not present

## 2020-09-17 DIAGNOSIS — Z1211 Encounter for screening for malignant neoplasm of colon: Secondary | ICD-10-CM

## 2020-09-17 MED ORDER — SODIUM CHLORIDE 0.9 % IV SOLN: 500.0000 mL | Freq: Once | INTRAVENOUS | Status: DC

## 2020-09-17 NOTE — Progress Notes (Signed)
Called to room to assist during endoscopic procedure.  Patient ID and intended procedure confirmed with present staff. Received instructions for my participation in the procedure from the performing physician.  

## 2020-09-17 NOTE — Progress Notes (Signed)
Patient consents to observer being present for procedure.   

## 2020-09-17 NOTE — Patient Instructions (Signed)
Start high fiber diet Use FiberCon 1-2 tabs daily Continue current medications Await pathology results  YOU HAD AN ENDOSCOPIC PROCEDURE TODAY AT THE [facility]:   Refer to the procedure report that was given to you for any specific questions about what was found during the examination.  If the procedure report does not answer your questions, please call your gastroenterologist to clarify.  If you requested that your care partner not be given the details of your procedure findings, then the procedure report has been included in a sealed envelope for you to review at your convenience later.  YOU SHOULD EXPECT: Some feelings of bloating in the abdomen. Passage of more gas than usual.  Walking can help get rid of the air that was put into your GI tract during the procedure and reduce the bloating. If you had a lower endoscopy (such as a colonoscopy or flexible sigmoidoscopy) you may notice spotting of blood in your stool or on the toilet paper. If you underwent a bowel prep for your procedure, you may not have a normal bowel movement for a few days.  Please Note:  You might notice some irritation and congestion in your nose or some drainage.  This is from the oxygen used during your procedure.  There is no need for concern and it should clear up in a day or so.  SYMPTOMS TO REPORT IMMEDIATELY:   Following lower endoscopy (colonoscopy or flexible sigmoidoscopy):  Excessive amounts of blood in the stool  Significant tenderness or worsening of abdominal pains  Swelling of the abdomen that is new, acute  Fever of 100F or higher  For urgent or emergent issues, a gastroenterologist can be reached at any hour by calling [phone]. Do not use MyChart messaging for urgent concerns.   DIET:  We do recommend a small meal at first, but then you may proceed to your regular diet.  Drink plenty of fluids but you should avoid alcoholic beverages for 24 hours.  ACTIVITY:  You should plan to  take it easy for the rest of today and you should NOT DRIVE or use heavy machinery until tomorrow (because of the sedation medicines used during the test).    FOLLOW UP: Our staff will call the number listed on your records 48-72 hours following your procedure to check on you and address any questions or concerns that you may have regarding the information given to you following your procedure. If we do not reach you, we will leave a message.  We will attempt to reach you two times.  During this call, we will ask if you have developed any symptoms of COVID 19. If you develop any symptoms (ie: fever, flu-like symptoms, shortness of breath, cough etc.) before then, please call [phone].  If you test positive for Covid 19 in the 2 weeks post procedure, please call and report this information to [place].    If any biopsies were taken you will be contacted by phone or by letter within the next 1-3 weeks.  Please call us at [phone] if you have not heard about the biopsies in 3 weeks.   SIGNATURES/CONFIDENTIALITY: You and/or your care partner have signed paperwork which will be entered into your electronic medical record.  These signatures attest to the fact that that the information above on your After Visit Summary has been reviewed and is understood.  Full responsibility of the confidentiality of this discharge information lies with you and/or your care-partner.

## 2020-09-17 NOTE — Op Note (Signed)
[facility] Patient Name: Cody Powers Procedure Date: 09/17/2020 8:52 AM MRN: [id number] Endoscopist: Justice Britain , MD Age: 64 Referring MD:  Date of Birth: 03-13-1956 Gender: Male Account #: [id number] Procedure:                Colonoscopy Indications:              Screening for malignant neoplasm in the colon Medicines:                Monitored Anesthesia Care Procedure:                Pre-Anesthesia Assessment:                           - Prior to the procedure, a History and Physical                            was performed, and patient medications and                            allergies were reviewed. The patient's tolerance of                            previous anesthesia was also reviewed. The risks                            and benefits of the procedure and the sedation                            options and risks were discussed with the patient.                            All questions were answered, and informed consent                            was obtained. Prior Anticoagulants: The patient has                            taken no previous anticoagulant or antiplatelet                            agents except for aspirin. ASA Grade Assessment: II                            - A patient with mild systemic disease. After                            reviewing the risks and benefits, the patient was                            deemed in satisfactory condition to undergo the                            procedure.  After obtaining informed consent, the colonoscope                            was passed under direct vision. Throughout the                            procedure, the patient's blood pressure, pulse, and                            oxygen saturations were monitored continuously. The                            Olympus CF-HQ190L (Serial# [id number]) Colonoscope was                            introduced through the anus and advanced  to the 3                            cm into the ileum. The colonoscopy was performed                            without difficulty. The patient tolerated the                            procedure. The quality of the bowel preparation was                            good. The terminal ileum, ileocecal valve,                            appendiceal orifice, and rectum were photographed. Scope In: 9:05:58 AM Scope Out: 9:22:05 AM Scope Withdrawal Time: 0 hours 10 minutes 47 seconds  Total Procedure Duration: 0 hours 16 minutes 7 seconds  Findings:                 The digital rectal exam findings include                            hemorrhoids. Pertinent negatives include no                            palpable rectal lesions.                           The terminal ileum and ileocecal valve appeared                            normal.                           A 3 mm polyp was found in the ascending colon. The                            polyp was sessile. The polyp was removed with a  cold snare. Resection and retrieval were complete.                           There was a small lipoma, in the mid ascending                            colon.                           Patchy areas of mildly erythematous mucosa were                            found in the recto-sigmoid colon and in the sigmoid                            colon. This was biopsied with a cold forceps for                            histology to rule out chronic colitis.                           Normal mucosa was found in the entire colon                            otherwise.                           Non-bleeding non-thrombosed external and internal                            hemorrhoids were found during retroflexion, during                            perianal exam and during digital exam. The                            hemorrhoids were Grade II (internal hemorrhoids                            that prolapse  but reduce spontaneously). Internal                            anal papillae noted. Complications:            No immediate complications. Estimated Blood Loss:     Estimated blood loss was minimal. Impression:               - Hemorrhoids found on digital rectal exam.                           - The examined portion of the ileum was normal.                           - One 3 mm polyp in the ascending colon, removed  with a cold snare. Resected and retrieved.                           - Small lipoma in the mid ascending colon.                           - Erythematous mucosa in the recto-sigmoid colon                            and in the sigmoid colon. Biopsied to rule out                            chronic proctitis - likely preparation artifact.                           - Normal mucosa in the entire examined colon                            otherwise.                           - Non-bleeding non-thrombosed external and internal                            hemorrhoids and internal anal papillae. Recommendation:           - The patient will be observed post-procedure,                            until all discharge criteria are met.                           - Discharge patient to home.                           - Patient has a contact number available for                            emergencies. The signs and symptoms of potential                            delayed complications were discussed with the                            patient. Return to normal activities tomorrow.                            Written discharge instructions were provided to the                            patient.                           - High fiber diet.                           -  Use FiberCon 1-2 tablets PO daily.                           - Continue present medications.                           - Await pathology results.                           - Repeat colonoscopy in 7/10 years for  surveillance                            based on pathology results and findings of                            adenomatous tissue.                           - The findings and recommendations were discussed                            with the patient.                           - The findings and recommendations were discussed                            with the patient's family. Justice Britain, MD 09/17/2020 9:32:40 AM

## 2020-09-17 NOTE — Progress Notes (Signed)
Pt's states no medical or surgical changes since previsit or office visit. 

## 2020-09-17 NOTE — Progress Notes (Signed)
pt tolerated well. VSS. awake and to recovery. Report given to RN.  

## 2020-09-18 ENCOUNTER — Telehealth: Payer: Self-pay

## 2020-09-18 NOTE — Telephone Encounter (Signed)
Voicemail not set up so unable to leave message. °

## 2020-09-18 NOTE — Telephone Encounter (Signed)
°  Follow up Call-  Call back number 09/17/2020  Post procedure Call Back phone  # [phone]  Permission to leave phone message Yes  Some recent data might be hidden     Patient questions:  Do you have a fever, pain , or abdominal swelling? No. Pain Score  0 *  Have you tolerated food without any problems? Yes.    Have you been able to return to your normal activities? Yes.    Do you have any questions about your discharge instructions: Diet   No. Medications  No. Follow up visit  No.  Do you have questions or concerns about your Care? No.  Actions: * If pain score is 4 or above: No action needed, pain <4.  1. Have you developed a fever since your procedure? no  2.   Have you had an respiratory symptoms (SOB or cough) since your procedure? no  3.   Have you tested positive for COVID 19 since your procedure no  4.   Have you had any family members/close contacts diagnosed with the COVID 19 since your procedure?  no   If yes to any of these questions please route to Laverna Peace, RN and Karlton Lemon, RN

## 2020-09-23 DIAGNOSIS — M170 Bilateral primary osteoarthritis of knee: Secondary | ICD-10-CM | POA: Diagnosis not present

## 2020-09-23 DIAGNOSIS — E782 Mixed hyperlipidemia: Secondary | ICD-10-CM | POA: Diagnosis not present

## 2020-09-23 DIAGNOSIS — E1169 Type 2 diabetes mellitus with other specified complication: Secondary | ICD-10-CM | POA: Diagnosis not present

## 2020-09-23 DIAGNOSIS — I10 Essential (primary) hypertension: Secondary | ICD-10-CM | POA: Diagnosis not present

## 2020-09-29 ENCOUNTER — Encounter: Payer: Self-pay | Admitting: Gastroenterology

## 2020-12-18 DIAGNOSIS — L570 Actinic keratosis: Secondary | ICD-10-CM | POA: Diagnosis not present

## 2020-12-18 DIAGNOSIS — L309 Dermatitis, unspecified: Secondary | ICD-10-CM | POA: Diagnosis not present

## 2021-01-02 DIAGNOSIS — M170 Bilateral primary osteoarthritis of knee: Secondary | ICD-10-CM | POA: Diagnosis not present

## 2021-01-02 DIAGNOSIS — E782 Mixed hyperlipidemia: Secondary | ICD-10-CM | POA: Diagnosis not present

## 2021-01-02 DIAGNOSIS — I10 Essential (primary) hypertension: Secondary | ICD-10-CM | POA: Diagnosis not present

## 2021-01-02 DIAGNOSIS — E1169 Type 2 diabetes mellitus with other specified complication: Secondary | ICD-10-CM | POA: Diagnosis not present

## 2021-01-02 DIAGNOSIS — E119 Type 2 diabetes mellitus without complications: Secondary | ICD-10-CM | POA: Diagnosis not present

## 2021-01-22 DIAGNOSIS — I10 Essential (primary) hypertension: Secondary | ICD-10-CM | POA: Diagnosis not present

## 2021-01-22 DIAGNOSIS — E782 Mixed hyperlipidemia: Secondary | ICD-10-CM | POA: Diagnosis not present

## 2021-01-22 DIAGNOSIS — E1169 Type 2 diabetes mellitus with other specified complication: Secondary | ICD-10-CM | POA: Diagnosis not present

## 2021-01-22 DIAGNOSIS — G5603 Carpal tunnel syndrome, bilateral upper limbs: Secondary | ICD-10-CM | POA: Diagnosis not present

## 2021-05-30 DIAGNOSIS — I10 Essential (primary) hypertension: Secondary | ICD-10-CM | POA: Diagnosis not present

## 2021-05-30 DIAGNOSIS — E1169 Type 2 diabetes mellitus with other specified complication: Secondary | ICD-10-CM | POA: Diagnosis not present

## 2021-06-02 DIAGNOSIS — E782 Mixed hyperlipidemia: Secondary | ICD-10-CM | POA: Diagnosis not present

## 2021-06-02 DIAGNOSIS — E1169 Type 2 diabetes mellitus with other specified complication: Secondary | ICD-10-CM | POA: Diagnosis not present

## 2021-06-02 DIAGNOSIS — R808 Other proteinuria: Secondary | ICD-10-CM | POA: Diagnosis not present

## 2021-06-02 DIAGNOSIS — I10 Essential (primary) hypertension: Secondary | ICD-10-CM | POA: Diagnosis not present

## 2021-08-02 DIAGNOSIS — R059 Cough, unspecified: Secondary | ICD-10-CM | POA: Diagnosis not present

## 2021-09-29 DIAGNOSIS — E782 Mixed hyperlipidemia: Secondary | ICD-10-CM | POA: Diagnosis not present

## 2021-09-29 DIAGNOSIS — E1169 Type 2 diabetes mellitus with other specified complication: Secondary | ICD-10-CM | POA: Diagnosis not present

## 2021-10-03 DIAGNOSIS — I10 Essential (primary) hypertension: Secondary | ICD-10-CM | POA: Diagnosis not present

## 2021-10-03 DIAGNOSIS — E782 Mixed hyperlipidemia: Secondary | ICD-10-CM | POA: Diagnosis not present

## 2021-10-03 DIAGNOSIS — E1169 Type 2 diabetes mellitus with other specified complication: Secondary | ICD-10-CM | POA: Diagnosis not present

## 2021-10-03 DIAGNOSIS — R808 Other proteinuria: Secondary | ICD-10-CM | POA: Diagnosis not present

## 2022-01-06 DIAGNOSIS — E782 Mixed hyperlipidemia: Secondary | ICD-10-CM | POA: Diagnosis not present

## 2022-01-06 DIAGNOSIS — E1169 Type 2 diabetes mellitus with other specified complication: Secondary | ICD-10-CM | POA: Diagnosis not present

## 2022-01-13 DIAGNOSIS — R808 Other proteinuria: Secondary | ICD-10-CM | POA: Diagnosis not present

## 2022-01-13 DIAGNOSIS — E782 Mixed hyperlipidemia: Secondary | ICD-10-CM | POA: Diagnosis not present

## 2022-01-13 DIAGNOSIS — E1169 Type 2 diabetes mellitus with other specified complication: Secondary | ICD-10-CM | POA: Diagnosis not present

## 2022-01-13 DIAGNOSIS — I10 Essential (primary) hypertension: Secondary | ICD-10-CM | POA: Diagnosis not present

## 2022-02-12 DIAGNOSIS — E1169 Type 2 diabetes mellitus with other specified complication: Secondary | ICD-10-CM | POA: Diagnosis not present

## 2022-02-12 DIAGNOSIS — R808 Other proteinuria: Secondary | ICD-10-CM | POA: Diagnosis not present

## 2022-02-12 DIAGNOSIS — I10 Essential (primary) hypertension: Secondary | ICD-10-CM | POA: Diagnosis not present

## 2022-02-12 DIAGNOSIS — E669 Obesity, unspecified: Secondary | ICD-10-CM | POA: Diagnosis not present

## 2022-04-10 DIAGNOSIS — E782 Mixed hyperlipidemia: Secondary | ICD-10-CM | POA: Diagnosis not present

## 2022-04-10 DIAGNOSIS — E1169 Type 2 diabetes mellitus with other specified complication: Secondary | ICD-10-CM | POA: Diagnosis not present

## 2022-04-10 DIAGNOSIS — E790 Hyperuricemia without signs of inflammatory arthritis and tophaceous disease: Secondary | ICD-10-CM | POA: Diagnosis not present

## 2022-04-28 DIAGNOSIS — R808 Other proteinuria: Secondary | ICD-10-CM | POA: Diagnosis not present

## 2022-04-28 DIAGNOSIS — E782 Mixed hyperlipidemia: Secondary | ICD-10-CM | POA: Diagnosis not present

## 2022-04-28 DIAGNOSIS — E1169 Type 2 diabetes mellitus with other specified complication: Secondary | ICD-10-CM | POA: Diagnosis not present

## 2022-04-28 DIAGNOSIS — I10 Essential (primary) hypertension: Secondary | ICD-10-CM | POA: Diagnosis not present

## 2022-07-17 DIAGNOSIS — M25551 Pain in right hip: Secondary | ICD-10-CM | POA: Diagnosis not present

## 2022-09-10 DIAGNOSIS — E1169 Type 2 diabetes mellitus with other specified complication: Secondary | ICD-10-CM | POA: Diagnosis not present

## 2022-09-10 DIAGNOSIS — E782 Mixed hyperlipidemia: Secondary | ICD-10-CM | POA: Diagnosis not present

## 2022-09-10 DIAGNOSIS — E790 Hyperuricemia without signs of inflammatory arthritis and tophaceous disease: Secondary | ICD-10-CM | POA: Diagnosis not present

## 2022-09-17 DIAGNOSIS — E790 Hyperuricemia without signs of inflammatory arthritis and tophaceous disease: Secondary | ICD-10-CM | POA: Diagnosis not present

## 2022-09-17 DIAGNOSIS — E1169 Type 2 diabetes mellitus with other specified complication: Secondary | ICD-10-CM | POA: Diagnosis not present

## 2022-09-17 DIAGNOSIS — Z23 Encounter for immunization: Secondary | ICD-10-CM | POA: Diagnosis not present

## 2022-09-17 DIAGNOSIS — E782 Mixed hyperlipidemia: Secondary | ICD-10-CM | POA: Diagnosis not present

## 2022-09-17 DIAGNOSIS — R808 Other proteinuria: Secondary | ICD-10-CM | POA: Diagnosis not present

## 2022-09-17 DIAGNOSIS — I10 Essential (primary) hypertension: Secondary | ICD-10-CM | POA: Diagnosis not present

## 2022-10-01 DIAGNOSIS — L900 Lichen sclerosus et atrophicus: Secondary | ICD-10-CM | POA: Diagnosis not present

## 2022-10-01 DIAGNOSIS — D225 Melanocytic nevi of trunk: Secondary | ICD-10-CM | POA: Diagnosis not present

## 2022-10-01 DIAGNOSIS — Z1283 Encounter for screening for malignant neoplasm of skin: Secondary | ICD-10-CM | POA: Diagnosis not present

## 2022-10-01 DIAGNOSIS — L918 Other hypertrophic disorders of the skin: Secondary | ICD-10-CM | POA: Diagnosis not present

## 2022-10-09 DIAGNOSIS — M25561 Pain in right knee: Secondary | ICD-10-CM | POA: Diagnosis not present

## 2022-10-19 DIAGNOSIS — S76311D Strain of muscle, fascia and tendon of the posterior muscle group at thigh level, right thigh, subsequent encounter: Secondary | ICD-10-CM | POA: Diagnosis not present

## 2022-10-19 DIAGNOSIS — M5431 Sciatica, right side: Secondary | ICD-10-CM | POA: Diagnosis not present

## 2022-11-02 DIAGNOSIS — M25561 Pain in right knee: Secondary | ICD-10-CM | POA: Diagnosis not present

## 2022-11-02 DIAGNOSIS — M5431 Sciatica, right side: Secondary | ICD-10-CM | POA: Diagnosis not present

## 2022-11-02 DIAGNOSIS — S76311D Strain of muscle, fascia and tendon of the posterior muscle group at thigh level, right thigh, subsequent encounter: Secondary | ICD-10-CM | POA: Diagnosis not present

## 2022-11-30 DIAGNOSIS — H5203 Hypermetropia, bilateral: Secondary | ICD-10-CM | POA: Diagnosis not present

## 2023-01-07 DIAGNOSIS — E1169 Type 2 diabetes mellitus with other specified complication: Secondary | ICD-10-CM | POA: Diagnosis not present

## 2023-01-07 DIAGNOSIS — E782 Mixed hyperlipidemia: Secondary | ICD-10-CM | POA: Diagnosis not present

## 2023-01-14 DIAGNOSIS — I10 Essential (primary) hypertension: Secondary | ICD-10-CM | POA: Diagnosis not present

## 2023-01-14 DIAGNOSIS — E1169 Type 2 diabetes mellitus with other specified complication: Secondary | ICD-10-CM | POA: Diagnosis not present

## 2023-01-14 DIAGNOSIS — E669 Obesity, unspecified: Secondary | ICD-10-CM | POA: Diagnosis not present

## 2023-01-14 DIAGNOSIS — R808 Other proteinuria: Secondary | ICD-10-CM | POA: Diagnosis not present

## 2023-01-14 DIAGNOSIS — E782 Mixed hyperlipidemia: Secondary | ICD-10-CM | POA: Diagnosis not present

## 2023-01-14 DIAGNOSIS — E790 Hyperuricemia without signs of inflammatory arthritis and tophaceous disease: Secondary | ICD-10-CM | POA: Diagnosis not present

## 2023-01-31 DIAGNOSIS — J019 Acute sinusitis, unspecified: Secondary | ICD-10-CM | POA: Diagnosis not present

## 2023-05-21 DIAGNOSIS — Z125 Encounter for screening for malignant neoplasm of prostate: Secondary | ICD-10-CM | POA: Diagnosis not present

## 2023-05-21 DIAGNOSIS — E1169 Type 2 diabetes mellitus with other specified complication: Secondary | ICD-10-CM | POA: Diagnosis not present

## 2023-05-21 DIAGNOSIS — E782 Mixed hyperlipidemia: Secondary | ICD-10-CM | POA: Diagnosis not present

## 2023-06-01 DIAGNOSIS — E1169 Type 2 diabetes mellitus with other specified complication: Secondary | ICD-10-CM | POA: Diagnosis not present

## 2023-06-01 DIAGNOSIS — E782 Mixed hyperlipidemia: Secondary | ICD-10-CM | POA: Diagnosis not present

## 2023-06-01 DIAGNOSIS — Z23 Encounter for immunization: Secondary | ICD-10-CM | POA: Diagnosis not present

## 2023-06-01 DIAGNOSIS — I10 Essential (primary) hypertension: Secondary | ICD-10-CM | POA: Diagnosis not present

## 2023-06-01 DIAGNOSIS — R808 Other proteinuria: Secondary | ICD-10-CM | POA: Diagnosis not present

## 2023-07-02 DIAGNOSIS — S0500XA Injury of conjunctiva and corneal abrasion without foreign body, unspecified eye, initial encounter: Secondary | ICD-10-CM | POA: Diagnosis not present

## 2023-07-02 DIAGNOSIS — Z6830 Body mass index (BMI) 30.0-30.9, adult: Secondary | ICD-10-CM | POA: Diagnosis not present

## 2023-07-02 DIAGNOSIS — E669 Obesity, unspecified: Secondary | ICD-10-CM | POA: Diagnosis not present

## 2023-10-15 DIAGNOSIS — E1169 Type 2 diabetes mellitus with other specified complication: Secondary | ICD-10-CM | POA: Diagnosis not present

## 2023-10-15 DIAGNOSIS — E782 Mixed hyperlipidemia: Secondary | ICD-10-CM | POA: Diagnosis not present

## 2023-10-22 DIAGNOSIS — E782 Mixed hyperlipidemia: Secondary | ICD-10-CM | POA: Diagnosis not present

## 2023-10-22 DIAGNOSIS — R808 Other proteinuria: Secondary | ICD-10-CM | POA: Diagnosis not present

## 2023-10-22 DIAGNOSIS — J302 Other seasonal allergic rhinitis: Secondary | ICD-10-CM | POA: Diagnosis not present

## 2023-10-22 DIAGNOSIS — I10 Essential (primary) hypertension: Secondary | ICD-10-CM | POA: Diagnosis not present

## 2023-10-22 DIAGNOSIS — E1169 Type 2 diabetes mellitus with other specified complication: Secondary | ICD-10-CM | POA: Diagnosis not present
